# Patient Record
Sex: Male | Born: 1980 | Race: Black or African American | Hispanic: No | Marital: Single | Smoking: Former smoker
Health system: Southern US, Community
[De-identification: ages and names within clinical notes are randomized; demographics above are authoritative.]

## PROBLEM LIST (undated history)

## (undated) DIAGNOSIS — Z789 Other specified health status: Secondary | ICD-10-CM

## (undated) HISTORY — PX: NO PAST SURGERIES: SHX2092

---

## 2008-05-12 ENCOUNTER — Emergency Department: Payer: Self-pay | Admitting: Emergency Medicine

## 2008-10-24 ENCOUNTER — Emergency Department: Payer: Self-pay | Admitting: Emergency Medicine

## 2012-08-21 ENCOUNTER — Encounter (HOSPITAL_COMMUNITY): Payer: Self-pay | Admitting: *Deleted

## 2012-08-21 ENCOUNTER — Emergency Department (HOSPITAL_COMMUNITY): Payer: Self-pay

## 2012-08-21 ENCOUNTER — Inpatient Hospital Stay (HOSPITAL_COMMUNITY)
Admission: EM | Admit: 2012-08-21 | Discharge: 2012-08-23 | DRG: 508 | Disposition: A | Discharge status: Home / Self Care | Payer: MEDICAID | Attending: Orthopedic Surgery | Admitting: Orthopedic Surgery

## 2012-08-21 DIAGNOSIS — Z79899 Other long term (current) drug therapy: Secondary | ICD-10-CM

## 2012-08-21 DIAGNOSIS — S52043B Displaced fracture of coronoid process of unspecified ulna, initial encounter for open fracture type I or II: Secondary | ICD-10-CM | POA: Diagnosis present

## 2012-08-21 DIAGNOSIS — Y92410 Unspecified street and highway as the place of occurrence of the external cause: Secondary | ICD-10-CM

## 2012-08-21 DIAGNOSIS — S0003XA Contusion of scalp, initial encounter: Secondary | ICD-10-CM | POA: Diagnosis present

## 2012-08-21 DIAGNOSIS — IMO0002 Reserved for concepts with insufficient information to code with codable children: Secondary | ICD-10-CM | POA: Diagnosis present

## 2012-08-21 DIAGNOSIS — S42402B Unspecified fracture of lower end of left humerus, initial encounter for open fracture: Secondary | ICD-10-CM

## 2012-08-21 DIAGNOSIS — F17200 Nicotine dependence, unspecified, uncomplicated: Secondary | ICD-10-CM | POA: Diagnosis present

## 2012-08-21 DIAGNOSIS — S52123B Displaced fracture of head of unspecified radius, initial encounter for open fracture type I or II: Principal | ICD-10-CM | POA: Diagnosis present

## 2012-08-21 DIAGNOSIS — F1210 Cannabis abuse, uncomplicated: Secondary | ICD-10-CM | POA: Diagnosis present

## 2012-08-21 HISTORY — DX: Other specified health status: Z78.9

## 2012-08-21 LAB — CBC WITH DIFFERENTIAL/PLATELET
Basophils Absolute: 0 10*3/uL (ref 0.0–0.1)
HCT: 40.2 % (ref 39.0–52.0)
Lymphocytes Relative: 13 % (ref 12–46)
Monocytes Absolute: 1 10*3/uL (ref 0.1–1.0)
Neutro Abs: 9.1 10*3/uL — ABNORMAL HIGH (ref 1.7–7.7)
RDW: 14.2 % (ref 11.5–15.5)
WBC: 11.6 10*3/uL — ABNORMAL HIGH (ref 4.0–10.5)

## 2012-08-21 LAB — POCT I-STAT, CHEM 8
Calcium, Ion: 1.11 mmol/L — ABNORMAL LOW (ref 1.12–1.23)
Glucose, Bld: 108 mg/dL — ABNORMAL HIGH (ref 70–99)
HCT: 44 % (ref 39.0–52.0)
Hemoglobin: 15 g/dL (ref 13.0–17.0)
TCO2: 23 mmol/L (ref 0–100)

## 2012-08-21 MED ORDER — FENTANYL CITRATE 0.05 MG/ML IJ SOLN
100.0000 ug | Freq: Once | INTRAMUSCULAR | Status: AC
  Administered 2012-08-21: 100 ug via INTRAVENOUS
  Filled 2012-08-21: qty 2

## 2012-08-21 MED ORDER — TETANUS-DIPHTH-ACELL PERTUSSIS 5-2.5-18.5 LF-MCG/0.5 IM SUSP
INTRAMUSCULAR | Status: AC
  Filled 2012-08-21: qty 0.5

## 2012-08-21 MED ORDER — TETANUS-DIPHTH-ACELL PERTUSSIS 5-2.5-18.5 LF-MCG/0.5 IM SUSP
0.5000 mL | Freq: Once | INTRAMUSCULAR | Status: AC
  Administered 2012-08-21: 0.5 mL via INTRAMUSCULAR
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
## 2012-08-22 ENCOUNTER — Encounter (HOSPITAL_COMMUNITY): Payer: Self-pay | Admitting: Certified Registered Nurse Anesthetist

## 2012-08-22 ENCOUNTER — Emergency Department (HOSPITAL_COMMUNITY): Payer: Self-pay

## 2012-08-22 ENCOUNTER — Encounter (HOSPITAL_COMMUNITY)
Admission: EM | Disposition: A | Discharge status: Home / Self Care | Payer: Self-pay | Source: Home / Self Care | Attending: Orthopedic Surgery

## 2012-08-22 ENCOUNTER — Emergency Department (HOSPITAL_COMMUNITY): Payer: Self-pay | Admitting: Certified Registered Nurse Anesthetist

## 2012-08-22 HISTORY — PX: ORIF ELBOW FRACTURE: SHX5031

## 2012-08-22 LAB — URINALYSIS, ROUTINE W REFLEX MICROSCOPIC
Ketones, ur: NEGATIVE mg/dL
Leukocytes, UA: NEGATIVE
Nitrite: NEGATIVE
Protein, ur: NEGATIVE mg/dL
Urobilinogen, UA: 0.2 mg/dL (ref 0.0–1.0)
pH: 5.5 (ref 5.0–8.0)

## 2012-08-22 LAB — CK TOTAL AND CKMB (NOT AT ARMC)
CK, MB: 4.1 ng/mL — ABNORMAL HIGH (ref 0.3–4.0)
Relative Index: 0.7 (ref 0.0–2.5)
Total CK: 550 U/L — ABNORMAL HIGH (ref 7–232)

## 2012-08-22 LAB — COMPREHENSIVE METABOLIC PANEL
Alkaline Phosphatase: 51 U/L (ref 39–117)
BUN: 10 mg/dL (ref 6–23)
Calcium: 8.9 mg/dL (ref 8.4–10.5)
GFR calc Af Amer: >90 mL/min (ref >90)
Glucose, Bld: 108 mg/dL — ABNORMAL HIGH (ref 70–99)
Total Protein: 7.1 g/dL (ref 6.0–8.3)

## 2012-08-22 SURGERY — OPEN REDUCTION INTERNAL FIXATION (ORIF) ELBOW/OLECRANON FRACTURE
Anesthesia: General | Site: Arm Lower | Laterality: Left | Wound class: Dirty or Infected

## 2012-08-22 MED ORDER — GLYCOPYRROLATE 0.2 MG/ML IJ SOLN
INTRAMUSCULAR | Status: DC | PRN
  Administered 2012-08-22: .8 mg via INTRAVENOUS

## 2012-08-22 MED ORDER — MORPHINE SULFATE 2 MG/ML IJ SOLN
1.0000 mg | INTRAMUSCULAR | Status: DC | PRN
  Administered 2012-08-22 – 2012-08-23 (×7): 1 mg via INTRAVENOUS
  Filled 2012-08-22 (×7): qty 1

## 2012-08-22 MED ORDER — ONDANSETRON HCL 4 MG/2ML IJ SOLN
4.0000 mg | Freq: Once | INTRAMUSCULAR | Status: AC
  Administered 2012-08-22: 4 mg via INTRAVENOUS

## 2012-08-22 MED ORDER — ONDANSETRON HCL 4 MG/2ML IJ SOLN
INTRAMUSCULAR | Status: DC | PRN
  Administered 2012-08-22 (×2): 4 mg via INTRAVENOUS

## 2012-08-22 MED ORDER — METHOCARBAMOL 500 MG PO TABS
500.0000 mg | ORAL_TABLET | Freq: Four times a day (QID) | ORAL | Status: DC | PRN
  Administered 2012-08-22: 500 mg via ORAL
  Filled 2012-08-22: qty 1

## 2012-08-22 MED ORDER — ACETAMINOPHEN 500 MG PO TABS
1000.0000 mg | ORAL_TABLET | Freq: Four times a day (QID) | ORAL | Status: DC | PRN
  Administered 2012-08-22: 975 mg via ORAL
  Filled 2012-08-22: qty 2

## 2012-08-22 MED ORDER — FENTANYL CITRATE 0.05 MG/ML IJ SOLN
INTRAMUSCULAR | Status: DC | PRN
  Administered 2012-08-22 (×2): 50 ug via INTRAVENOUS
  Administered 2012-08-22 (×2): 100 ug via INTRAVENOUS
  Administered 2012-08-22 (×2): 50 ug via INTRAVENOUS

## 2012-08-22 MED ORDER — SUCCINYLCHOLINE CHLORIDE 20 MG/ML IJ SOLN
INTRAMUSCULAR | Status: DC | PRN
  Administered 2012-08-22: 120 mg via INTRAVENOUS

## 2012-08-22 MED ORDER — DOCUSATE SODIUM 100 MG PO CAPS
100.0000 mg | ORAL_CAPSULE | Freq: Two times a day (BID) | ORAL | Status: DC
  Administered 2012-08-22 – 2012-08-23 (×3): 100 mg via ORAL
  Filled 2012-08-22 (×3): qty 1

## 2012-08-22 MED ORDER — DEXAMETHASONE SODIUM PHOSPHATE 4 MG/ML IJ SOLN
INTRAMUSCULAR | Status: DC | PRN
  Administered 2012-08-22: 8 mg via INTRAVENOUS

## 2012-08-22 MED ORDER — LACTATED RINGERS IV SOLN
INTRAVENOUS | Status: DC
  Administered 2012-08-22: via INTRAVENOUS

## 2012-08-22 MED ORDER — VITAMIN C 500 MG PO TABS
1000.0000 mg | ORAL_TABLET | Freq: Every day | ORAL | Status: DC
  Administered 2012-08-22 – 2012-08-23 (×2): 1000 mg via ORAL
  Filled 2012-08-22 (×2): qty 2

## 2012-08-22 MED ORDER — LIDOCAINE HCL 4 % MT SOLN
OROMUCOSAL | Status: DC | PRN
  Administered 2012-08-22: 3 mL via TOPICAL

## 2012-08-22 MED ORDER — BACITRACIN-NEOMYCIN-POLYMYXIN 400-5-5000 EX OINT
TOPICAL_OINTMENT | CUTANEOUS | Status: AC
  Filled 2012-08-22: qty 1

## 2012-08-22 MED ORDER — ONDANSETRON HCL 4 MG/2ML IJ SOLN
4.0000 mg | Freq: Once | INTRAMUSCULAR | Status: AC | PRN
  Administered 2012-08-22: 4 mg via INTRAVENOUS

## 2012-08-22 MED ORDER — MIDAZOLAM HCL 5 MG/5ML IJ SOLN
INTRAMUSCULAR | Status: DC | PRN
  Administered 2012-08-22: 2 mg via INTRAVENOUS

## 2012-08-22 MED ORDER — ONDANSETRON HCL 4 MG/2ML IJ SOLN: 4.0000 mg | Freq: Four times a day (QID) | INTRAMUSCULAR | Status: DC | PRN

## 2012-08-22 MED ORDER — HYDROMORPHONE HCL PF 1 MG/ML IJ SOLN
1.0000 mg | Freq: Once | INTRAMUSCULAR | Status: AC
  Administered 2012-08-22: 1 mg via INTRAVENOUS

## 2012-08-22 MED ORDER — OXYCODONE HCL 5 MG PO TABS: 5.0000 mg | ORAL_TABLET | Freq: Once | ORAL | Status: DC | PRN

## 2012-08-22 MED ORDER — BACITRACIN-NEOMYCIN-POLYMYXIN 400-5-5000 EX OINT
TOPICAL_OINTMENT | CUTANEOUS | Status: DC | PRN
  Administered 2012-08-22: 1 via TOPICAL

## 2012-08-22 MED ORDER — METHOCARBAMOL 100 MG/ML IJ SOLN
500.0000 mg | Freq: Four times a day (QID) | INTRAVENOUS | Status: DC | PRN
  Filled 2012-08-22: qty 5

## 2012-08-22 MED ORDER — TAPENTADOL HCL 50 MG PO TABS
100.0000 mg | ORAL_TABLET | ORAL | Status: DC | PRN
  Administered 2012-08-22 (×2): 100 mg via ORAL
  Filled 2012-08-22 (×2): qty 2

## 2012-08-22 MED ORDER — HYDROMORPHONE HCL PF 1 MG/ML IJ SOLN
1.0000 mg | INTRAMUSCULAR | Status: AC
  Administered 2012-08-22: 1 mg via INTRAVENOUS
  Filled 2012-08-22: qty 1

## 2012-08-22 MED ORDER — SODIUM CHLORIDE 0.9 % IV SOLN
INTRAVENOUS | Status: DC | PRN
  Administered 2012-08-22: 03:00:00 via INTRAVENOUS

## 2012-08-22 MED ORDER — HYDROMORPHONE HCL PF 1 MG/ML IJ SOLN: 0.2500 mg | INTRAMUSCULAR | Status: DC | PRN

## 2012-08-22 MED ORDER — PROMETHAZINE HCL 25 MG/ML IJ SOLN
INTRAMUSCULAR | Status: AC
  Filled 2012-08-22: qty 1

## 2012-08-22 MED ORDER — PROMETHAZINE HCL 25 MG/ML IJ SOLN
6.2500 mg | INTRAMUSCULAR | Status: AC | PRN
  Administered 2012-08-22 (×2): 6.25 mg via INTRAVENOUS

## 2012-08-22 MED ORDER — LIDOCAINE HCL (CARDIAC) 20 MG/ML IV SOLN
INTRAVENOUS | Status: DC | PRN
  Administered 2012-08-22: 60 mg via INTRAVENOUS

## 2012-08-22 MED ORDER — PROMETHAZINE HCL 25 MG/ML IJ SOLN
6.2500 mg | Freq: Four times a day (QID) | INTRAMUSCULAR | Status: DC | PRN
Start: 2012-08-22 — End: 2012-08-22

## 2012-08-22 MED ORDER — 0.9 % SODIUM CHLORIDE (POUR BTL) OPTIME
TOPICAL | Status: DC | PRN
  Administered 2012-08-22: 1000 mL

## 2012-08-22 MED ORDER — ONDANSETRON HCL 4 MG/2ML IJ SOLN
INTRAMUSCULAR | Status: AC
  Filled 2012-08-22: qty 2

## 2012-08-22 MED ORDER — DEXTROSE 5 % IV SOLN
INTRAVENOUS | Status: DC | PRN
  Administered 2012-08-22: 03:00:00 via INTRAVENOUS

## 2012-08-22 MED ORDER — SODIUM CHLORIDE 0.9 % IR SOLN
Status: DC | PRN
  Administered 2012-08-22 (×2): 3000 mL

## 2012-08-22 MED ORDER — ROCURONIUM BROMIDE 100 MG/10ML IV SOLN
INTRAVENOUS | Status: DC | PRN
  Administered 2012-08-22: 50 mg via INTRAVENOUS
  Administered 2012-08-22: 10 mg via INTRAVENOUS

## 2012-08-22 MED ORDER — NEOSTIGMINE METHYLSULFATE 1 MG/ML IJ SOLN
INTRAMUSCULAR | Status: DC | PRN
  Administered 2012-08-22: 5 mg via INTRAVENOUS

## 2012-08-22 MED ORDER — CEFAZOLIN SODIUM 1-5 GM-% IV SOLN
1.0000 g | Freq: Once | INTRAVENOUS | Status: AC
  Administered 2012-08-22: 1 g via INTRAVENOUS
  Filled 2012-08-22: qty 50

## 2012-08-22 MED ORDER — PROPOFOL 10 MG/ML IV BOLUS
INTRAVENOUS | Status: DC | PRN
  Administered 2012-08-22: 200 mg via INTRAVENOUS

## 2012-08-22 MED ORDER — CEFAZOLIN SODIUM 1-5 GM-% IV SOLN
INTRAVENOUS | Status: DC | PRN
  Administered 2012-08-22: 1 g via INTRAVENOUS

## 2012-08-22 MED ORDER — PROMETHAZINE HCL 25 MG RE SUPP: 12.5000 mg | Freq: Four times a day (QID) | RECTAL | Status: DC | PRN

## 2012-08-22 MED ORDER — FAMOTIDINE 20 MG PO TABS: 20.0000 mg | ORAL_TABLET | Freq: Two times a day (BID) | ORAL | Status: DC | PRN

## 2012-08-22 MED ORDER — GENTAMICIN SULFATE 40 MG/ML IJ SOLN
560.0000 mg | INTRAVENOUS | Status: AC
  Administered 2012-08-22 – 2012-08-23 (×2): 560 mg via INTRAVENOUS
  Filled 2012-08-22 (×2): qty 14

## 2012-08-22 MED ORDER — ACETAMINOPHEN 325 MG PO TABS
ORAL_TABLET | ORAL | Status: AC
  Filled 2012-08-22: qty 3

## 2012-08-22 MED ORDER — CEFAZOLIN SODIUM 1-5 GM-% IV SOLN
1.0000 g | Freq: Three times a day (TID) | INTRAVENOUS | Status: DC
  Administered 2012-08-22 – 2012-08-23 (×4): 1 g via INTRAVENOUS
  Filled 2012-08-22 (×6): qty 50

## 2012-08-22 MED ORDER — HYDROMORPHONE HCL PF 1 MG/ML IJ SOLN
INTRAMUSCULAR | Status: AC
  Filled 2012-08-22: qty 1

## 2012-08-22 MED ORDER — ALPRAZOLAM 0.5 MG PO TABS: 0.5000 mg | ORAL_TABLET | Freq: Four times a day (QID) | ORAL | Status: DC | PRN

## 2012-08-22 MED ORDER — OXYCODONE HCL 5 MG/5ML PO SOLN: 5.0000 mg | Freq: Once | ORAL | Status: DC | PRN

## 2012-08-22 MED ORDER — ARTIFICIAL TEARS OP OINT
TOPICAL_OINTMENT | OPHTHALMIC | Status: DC | PRN
  Administered 2012-08-22: 1 via OPHTHALMIC

## 2012-08-22 MED ORDER — LACTATED RINGERS IV SOLN
INTRAVENOUS | Status: DC | PRN
  Administered 2012-08-22 (×2): via INTRAVENOUS

## 2012-08-22 MED ORDER — ONDANSETRON HCL 4 MG PO TABS: 4.0000 mg | ORAL_TABLET | Freq: Four times a day (QID) | ORAL | Status: DC | PRN

## 2012-08-22 SURGICAL SUPPLY — 56 items
ANCHOR JUGGERKNOT SHT 1.4 SOFT (Anchor) ×4 IMPLANT
BANDAGE ELASTIC 3 VELCRO ST LF (GAUZE/BANDAGES/DRESSINGS) ×4 IMPLANT
BANDAGE ELASTIC 4 VELCRO ST LF (GAUZE/BANDAGES/DRESSINGS) ×4 IMPLANT
BANDAGE GAUZE ELAST BULKY 4 IN (GAUZE/BANDAGES/DRESSINGS) ×2 IMPLANT
BLADE AVERAGE 25X9 (BLADE) ×2 IMPLANT
BNDG ESMARK 4X9 LF (GAUZE/BANDAGES/DRESSINGS) ×2 IMPLANT
CLOTH BEACON ORANGE TIMEOUT ST (SAFETY) ×2 IMPLANT
CORDS BIPOLAR (ELECTRODE) ×2 IMPLANT
COVER MAYO STAND STRL (DRAPES) ×2 IMPLANT
COVER SURGICAL LIGHT HANDLE (MISCELLANEOUS) ×2 IMPLANT
CUFF TOURNIQUET SINGLE 18IN (TOURNIQUET CUFF) ×2 IMPLANT
DRAPE INCISE IOBAN 66X45 STRL (DRAPES) ×2 IMPLANT
DRAPE OEC MINIVIEW 54X84 (DRAPES) ×2 IMPLANT
DRSG ADAPTIC 3X8 NADH LF (GAUZE/BANDAGES/DRESSINGS) ×2 IMPLANT
GLOVE BIOGEL M STRL SZ7.5 (GLOVE) ×2 IMPLANT
GLOVE BIOGEL PI IND STRL 7.0 (GLOVE) ×1 IMPLANT
GLOVE BIOGEL PI IND STRL 7.5 (GLOVE) ×1 IMPLANT
GLOVE BIOGEL PI INDICATOR 7.0 (GLOVE) ×1
GLOVE BIOGEL PI INDICATOR 7.5 (GLOVE) ×1
GLOVE SS BIOGEL STRL SZ 8 (GLOVE) ×3 IMPLANT
GLOVE SUPERSENSE BIOGEL SZ 8 (GLOVE) ×3
GLOVE SURG SS PI 6.5 STRL IVOR (GLOVE) ×2 IMPLANT
GLOVE SURG SS PI 7.5 STRL IVOR (GLOVE) ×2 IMPLANT
GOWN PREVENTION PLUS XLARGE (GOWN DISPOSABLE) ×4 IMPLANT
GOWN STRL NON-REIN LRG LVL3 (GOWN DISPOSABLE) ×2 IMPLANT
GOWN STRL REIN XL XLG (GOWN DISPOSABLE) ×2 IMPLANT
HEAD EXPLOR 10X24MM (Head) ×2 IMPLANT
JUGGERKNOT SHORT SET 1.4MM (KITS) ×2 IMPLANT
KIT BASIN OR (CUSTOM PROCEDURE TRAY) ×2 IMPLANT
KIT ROOM TURNOVER OR (KITS) ×2 IMPLANT
MANIFOLD NEPTUNE II (INSTRUMENTS) ×2 IMPLANT
NS IRRIG 1000ML POUR BTL (IV SOLUTION) ×2 IMPLANT
PACK ORTHO EXTREMITY (CUSTOM PROCEDURE TRAY) ×2 IMPLANT
PAD ARMBOARD 7.5X6 YLW CONV (MISCELLANEOUS) ×2 IMPLANT
PAD CAST 3X4 CTTN HI CHSV (CAST SUPPLIES) ×1 IMPLANT
PAD CAST 4YDX4 CTTN HI CHSV (CAST SUPPLIES) IMPLANT
PADDING CAST COTTON 3X4 STRL (CAST SUPPLIES) ×1
PADDING CAST COTTON 4X4 STRL (CAST SUPPLIES)
SLING ARM FOAM STRAP MED (SOFTGOODS) ×2 IMPLANT
SOLUTION BETADINE 4OZ (MISCELLANEOUS) ×2 IMPLANT
SPECIMEN JAR SMALL (MISCELLANEOUS) ×2 IMPLANT
SPLINT FIBERGLASS 3X12 (CAST SUPPLIES) ×2 IMPLANT
SPLINT FIBERGLASS 4X30 (CAST SUPPLIES) ×2 IMPLANT
SPONGE GAUZE 4X4 12PLY (GAUZE/BANDAGES/DRESSINGS) ×2 IMPLANT
SPONGE SCRUB IODOPHOR (GAUZE/BANDAGES/DRESSINGS) ×2 IMPLANT
STEM IMPLANT W SCREW (Stem) ×2 IMPLANT
SUCTION FRAZIER TIP 10 FR DISP (SUCTIONS) ×2 IMPLANT
SUT MERSILENE 4 0 P 3 (SUTURE) IMPLANT
SUT PROLENE 3 0 PS 2 (SUTURE) ×6 IMPLANT
SUT PROLENE 4 0 PS 2 18 (SUTURE) IMPLANT
SUT VIC AB 0 CT1 27 (SUTURE) ×1
SUT VIC AB 0 CT1 27XBRD ANBCTR (SUTURE) ×1 IMPLANT
TOWEL OR 17X24 6PK STRL BLUE (TOWEL DISPOSABLE) ×2 IMPLANT
TOWEL OR 17X26 10 PK STRL BLUE (TOWEL DISPOSABLE) ×6 IMPLANT
TUBE CONNECTING 12X1/4 (SUCTIONS) ×2 IMPLANT
UNDERPAD 30X30 INCONTINENT (UNDERPADS AND DIAPERS) ×2 IMPLANT
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
## 2012-08-22 NOTE — Transfer of Care (Signed)
Immediate Anesthesia Transfer of Care Note  Patient: Jason Castillo.  Procedure(s) Performed: Procedure(s): OPEN REDUCTION INTERNAL FIXATION  LEFT ELBOW/OLECRANON FRACTURE (Left)  Patient Location: PACU  Anesthesia Type:General  Level of Consciousness: awake, alert  and oriented  Airway & Oxygen Therapy: Patient Spontanous Breathing and Patient connected to nasal cannula oxygen  Post-op Assessment: Report given to PACU RN and Post -op Vital signs reviewed and stable  Post vital signs: Reviewed and stable  Complications: No apparent anesthesia complications

## 2012-08-22 NOTE — Preoperative (Signed)
Beta Blockers   Reason not to administer Beta Blockers:Not Applicable 

## 2012-08-22 NOTE — Op Note (Signed)
See Dictation #[id number] Wendall Stade MD
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
## 2012-08-22 NOTE — Anesthesia Procedure Notes (Signed)
Procedure Name: Intubation Date/Time: 08/22/2012 3:01 AM Performed by: Julianne Rice Z Pre-anesthesia Checklist: Patient identified, Timeout performed, Emergency Drugs available, Suction available and Patient being monitored Patient Re-evaluated:Patient Re-evaluated prior to inductionOxygen Delivery Method: Circle system utilized Preoxygenation: Pre-oxygenation with 100% oxygen Intubation Type: Cricoid Pressure applied and IV induction Laryngoscope Size: Mac and 4 Grade View: Grade I Tube type: Oral Tube size: 8.0 mm Number of attempts: 1 Airway Equipment and Method: Stylet and LTA kit utilized Placement Confirmation: ETT inserted through vocal cords under direct vision,  breath sounds checked- equal and bilateral and positive ETCO2 Secured at: 22 cm Tube secured with: Tape Dental Injury: Teeth and Oropharynx as per pre-operative assessment

## 2012-08-22 NOTE — Anesthesia Preprocedure Evaluation (Signed)
Anesthesia Evaluation  Patient identified by MRN, date of birth, ID band Patient awake    Reviewed: Allergy & Precautions, H&P , NPO status , Patient's Chart, lab work & pertinent test results  Airway Mallampati: I TM Distance: >3 FB Neck ROM: Full    Dental  (+) Teeth Intact and Dental Advisory Given   Pulmonary  breath sounds clear to auscultation        Cardiovascular Rhythm:Regular Rate:Normal     Neuro/Psych    GI/Hepatic   Endo/Other    Renal/GU      Musculoskeletal   Abdominal   Peds  Hematology   Anesthesia Other Findings   Reproductive/Obstetrics                           Anesthesia Physical Anesthesia Plan  ASA: I and emergent  Anesthesia Plan: General   Post-op Pain Management:    Induction: Intravenous, Rapid sequence and Cricoid pressure planned  Airway Management Planned: Oral ETT  Additional Equipment:   Intra-op Plan:   Post-operative Plan: Extubation in OR  Informed Consent: I have reviewed the patients History and Physical, chart, labs and discussed the procedure including the risks, benefits and alternatives for the proposed anesthesia with the patient or authorized representative who has indicated his/her understanding and acceptance.   Dental advisory given  Plan Discussed with: CRNA, Anesthesiologist and Surgeon  Anesthesia Plan Comments:         Anesthesia Quick Evaluation
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
## 2012-08-22 NOTE — Progress Notes (Signed)
Jason Castillo. is a 32 y.o. male who has been consulted for aminoglycoside dosing.  The patient has a current serum creatinine of  Creatinine, Ser (mg/dL)  Date Value  1/61/0960 1.20    which calculates to an estimated CrCl of Estimated Creatinine Clearance: 109 ml/min (by C-G formula based on Cr of 1.2).  Current weight is 90kg  The patient will be started on gentamicin at a dose of 7 mg/kg/day per dosing Nomogram and ideal body weight.  Patient will be followed by pharmacy for changes in renal function, toxicity, and efficacy.  Serum creatinines will be ordered as needed.  Will start 560mg  IV Q24H x2 doses for post-op Px for open fracture.  Vernard Gambles, PharmD, BCPS

## 2012-08-22 NOTE — Anesthesia Postprocedure Evaluation (Signed)
  Anesthesia Post-op Note  Patient: Jason Castillo.  Procedure(s) Performed: Procedure(s): OPEN REDUCTION INTERNAL FIXATION  LEFT ELBOW/OLECRANON FRACTURE (Left)  Patient Location: PACU  Anesthesia Type:General  Level of Consciousness: awake, alert  and oriented  Airway and Oxygen Therapy: Patient Spontanous Breathing and Patient connected to nasal cannula oxygen  Post-op Pain: mild  Post-op Assessment: Post-op Vital signs reviewed and NAUSEA AND VOMITING PRESENT  Post-op Vital Signs: Reviewed  Complications: No apparent anesthesia complications

## 2012-08-22 NOTE — Progress Notes (Signed)
OT Cancellation Note  Patient Details Name: Jason Castillo. MRN: [id number] DOB: [birth year]   Cancelled Treatment:     Attempted to see pt this am; however, pt sleeping soundly.  Will reattempt this pm as able, or in am  [name] [phone] 08/22/2012, 6:30 PM
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
## 2012-08-23 ENCOUNTER — Encounter (HOSPITAL_COMMUNITY): Payer: Self-pay | Admitting: Orthopedic Surgery

## 2012-08-23 LAB — CBC WITH DIFFERENTIAL/PLATELET
Basophils Relative: 0 % (ref 0–1)
Eosinophils Absolute: 0 10*3/uL (ref 0.0–0.7)
Eosinophils Relative: 0 % (ref 0–5)
HCT: 35.3 % — ABNORMAL LOW (ref 39.0–52.0)
Hemoglobin: 11.7 g/dL — ABNORMAL LOW (ref 13.0–17.0)
MCH: 25.1 pg — ABNORMAL LOW (ref 26.0–34.0)
MCHC: 33.1 g/dL (ref 30.0–36.0)
MCV: 75.6 fL — ABNORMAL LOW (ref 78.0–100.0)
Monocytes Absolute: 1.6 10*3/uL — ABNORMAL HIGH (ref 0.1–1.0)
Monocytes Relative: 13 % — ABNORMAL HIGH (ref 3–12)

## 2012-08-23 LAB — BASIC METABOLIC PANEL
BUN: 7 mg/dL (ref 6–23)
CO2: 27 mEq/L (ref 19–32)
Chloride: 104 mEq/L (ref 96–112)
GFR calc non Af Amer: >90 mL/min (ref >90)
Glucose, Bld: 121 mg/dL — ABNORMAL HIGH (ref 70–99)
Potassium: 3.7 mEq/L (ref 3.5–5.1)

## 2012-08-23 MED ORDER — CEPHALEXIN 500 MG PO CAPS: 500.0000 mg | ORAL_CAPSULE | Freq: Four times a day (QID) | ORAL | Status: DC

## 2012-08-23 MED ORDER — METHOCARBAMOL 500 MG PO TABS: 500.0000 mg | ORAL_TABLET | Freq: Three times a day (TID) | ORAL | Status: DC

## 2012-08-23 MED ORDER — HYDROCODONE-ACETAMINOPHEN 5-325 MG PO TABS: 2.0000 | ORAL_TABLET | ORAL | Status: DC | PRN
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
## 2012-08-23 NOTE — Discharge Instructions (Signed)
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
## 2012-08-24 ENCOUNTER — Emergency Department (HOSPITAL_COMMUNITY): Payer: No Typology Code available for payment source

## 2012-08-24 ENCOUNTER — Encounter (HOSPITAL_COMMUNITY): Payer: Self-pay | Admitting: *Deleted

## 2012-08-24 ENCOUNTER — Telehealth (HOSPITAL_COMMUNITY): Payer: Self-pay | Admitting: *Deleted

## 2012-08-24 ENCOUNTER — Emergency Department (HOSPITAL_COMMUNITY)
Admission: EM | Admit: 2012-08-24 | Discharge: 2012-08-24 | Disposition: A | Discharge status: Home / Self Care | Payer: No Typology Code available for payment source | Attending: Emergency Medicine | Admitting: Emergency Medicine

## 2012-08-24 DIAGNOSIS — R112 Nausea with vomiting, unspecified: Secondary | ICD-10-CM

## 2012-08-24 DIAGNOSIS — R42 Dizziness and giddiness: Secondary | ICD-10-CM | POA: Insufficient documentation

## 2012-08-24 DIAGNOSIS — F17200 Nicotine dependence, unspecified, uncomplicated: Secondary | ICD-10-CM | POA: Insufficient documentation

## 2012-08-24 DIAGNOSIS — Z79899 Other long term (current) drug therapy: Secondary | ICD-10-CM | POA: Insufficient documentation

## 2012-08-24 DIAGNOSIS — Z87828 Personal history of other (healed) physical injury and trauma: Secondary | ICD-10-CM | POA: Insufficient documentation

## 2012-08-24 DIAGNOSIS — Z5189 Encounter for other specified aftercare: Secondary | ICD-10-CM | POA: Insufficient documentation

## 2012-08-24 DIAGNOSIS — F0781 Postconcussional syndrome: Secondary | ICD-10-CM | POA: Insufficient documentation

## 2012-08-24 DIAGNOSIS — R51 Headache: Secondary | ICD-10-CM | POA: Insufficient documentation

## 2012-08-24 DIAGNOSIS — S060X0S Concussion without loss of consciousness, sequela: Secondary | ICD-10-CM

## 2012-08-24 LAB — CBC WITH DIFFERENTIAL/PLATELET
Basophils Relative: 0 % (ref 0–1)
Eosinophils Absolute: 0 10*3/uL (ref 0.0–0.7)
Eosinophils Relative: 0 % (ref 0–5)
MCH: 25.3 pg — ABNORMAL LOW (ref 26.0–34.0)
MCHC: 33.4 g/dL (ref 30.0–36.0)
MCV: 75.6 fL — ABNORMAL LOW (ref 78.0–100.0)
Neutrophils Relative %: 76 % (ref 43–77)
Platelets: 230 10*3/uL (ref 150–400)

## 2012-08-24 LAB — POCT I-STAT, CHEM 8
Glucose, Bld: 119 mg/dL — ABNORMAL HIGH (ref 70–99)
HCT: 41 % (ref 39.0–52.0)
Hemoglobin: 13.9 g/dL (ref 13.0–17.0)
Potassium: 3.2 mEq/L — ABNORMAL LOW (ref 3.5–5.1)
Sodium: 138 mEq/L (ref 135–145)
TCO2: 26 mmol/L (ref 0–100)

## 2012-08-24 MED ORDER — IOHEXOL 350 MG/ML SOLN
80.0000 mL | Freq: Once | INTRAVENOUS | Status: AC | PRN
  Administered 2012-08-24: 80 mL via INTRAVENOUS

## 2012-08-24 MED ORDER — MORPHINE SULFATE 4 MG/ML IJ SOLN
4.0000 mg | Freq: Once | INTRAMUSCULAR | Status: AC
  Administered 2012-08-24: 4 mg via INTRAVENOUS
  Filled 2012-08-24: qty 1

## 2012-08-24 MED ORDER — ONDANSETRON HCL 4 MG/2ML IJ SOLN
4.0000 mg | Freq: Once | INTRAMUSCULAR | Status: AC
  Administered 2012-08-24: 4 mg via INTRAVENOUS
  Filled 2012-08-24: qty 2

## 2012-08-24 MED ORDER — TRAMADOL HCL 50 MG PO TABS: 50.0000 mg | ORAL_TABLET | Freq: Four times a day (QID) | ORAL | Status: DC | PRN

## 2012-08-24 MED ORDER — SODIUM CHLORIDE 0.9 % IV BOLUS (SEPSIS)
500.0000 mL | Freq: Once | INTRAVENOUS | Status: AC
  Administered 2012-08-24: 500 mL via INTRAVENOUS

## 2012-08-24 MED ORDER — PROMETHAZINE HCL 25 MG PO TABS: 25.0000 mg | ORAL_TABLET | Freq: Four times a day (QID) | ORAL | Status: DC | PRN

## 2012-08-24 MED ORDER — ONDANSETRON 8 MG PO TBDP: 8.0000 mg | ORAL_TABLET | Freq: Three times a day (TID) | ORAL | Status: DC | PRN

## 2012-08-24 NOTE — ED Notes (Signed)
Dr Pickering at bedside 
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
## 2012-08-24 NOTE — ED Notes (Signed)
1915  Received report from off going RN and introduced self to the pt.

## 2012-08-24 NOTE — ED Notes (Signed)
Pt taken to CT.

## 2015-02-28 ENCOUNTER — Encounter: Payer: Self-pay | Admitting: *Deleted

## 2015-02-28 ENCOUNTER — Emergency Department
Admission: EM | Admit: 2015-02-28 | Discharge: 2015-02-28 | Disposition: A | Discharge status: Home / Self Care | Payer: No Typology Code available for payment source | Attending: Emergency Medicine | Admitting: Emergency Medicine

## 2015-02-28 DIAGNOSIS — Y9389 Activity, other specified: Secondary | ICD-10-CM | POA: Insufficient documentation

## 2015-02-28 DIAGNOSIS — Z792 Long term (current) use of antibiotics: Secondary | ICD-10-CM | POA: Insufficient documentation

## 2015-02-28 DIAGNOSIS — S81812A Laceration without foreign body, left lower leg, initial encounter: Secondary | ICD-10-CM | POA: Insufficient documentation

## 2015-02-28 DIAGNOSIS — Z79899 Other long term (current) drug therapy: Secondary | ICD-10-CM | POA: Insufficient documentation

## 2015-02-28 DIAGNOSIS — Y998 Other external cause status: Secondary | ICD-10-CM | POA: Insufficient documentation

## 2015-02-28 DIAGNOSIS — Z23 Encounter for immunization: Secondary | ICD-10-CM | POA: Insufficient documentation

## 2015-02-28 DIAGNOSIS — S80812A Abrasion, left lower leg, initial encounter: Secondary | ICD-10-CM | POA: Insufficient documentation

## 2015-02-28 DIAGNOSIS — S81802A Unspecified open wound, left lower leg, initial encounter: Secondary | ICD-10-CM | POA: Insufficient documentation

## 2015-02-28 DIAGNOSIS — Z87891 Personal history of nicotine dependence: Secondary | ICD-10-CM | POA: Insufficient documentation

## 2015-02-28 DIAGNOSIS — S80811A Abrasion, right lower leg, initial encounter: Secondary | ICD-10-CM | POA: Insufficient documentation

## 2015-02-28 DIAGNOSIS — W540XXA Bitten by dog, initial encounter: Secondary | ICD-10-CM | POA: Insufficient documentation

## 2015-02-28 DIAGNOSIS — Y9289 Other specified places as the place of occurrence of the external cause: Secondary | ICD-10-CM | POA: Insufficient documentation

## 2015-02-28 MED ORDER — LIDOCAINE-EPINEPHRINE (PF) 1 %-1:200000 IJ SOLN
INTRAMUSCULAR | Status: DC
Start: 2015-02-28 — End: 2015-03-01
  Filled 2015-02-28: qty 30

## 2015-02-28 MED ORDER — LIDOCAINE-EPINEPHRINE (PF) 1 %-1:200000 IJ SOLN
30.0000 mL | Freq: Once | INTRAMUSCULAR | Status: AC
  Administered 2015-02-28: 30 mL

## 2015-02-28 MED ORDER — IBUPROFEN 800 MG PO TABS
800.0000 mg | ORAL_TABLET | Freq: Once | ORAL | Status: AC
  Administered 2015-02-28: 800 mg via ORAL
  Filled 2015-02-28: qty 1

## 2015-02-28 MED ORDER — AMOXICILLIN-POT CLAVULANATE 875-125 MG PO TABS: 1.0000 | ORAL_TABLET | Freq: Two times a day (BID) | ORAL | Status: AC

## 2015-02-28 MED ORDER — AMOXICILLIN-POT CLAVULANATE 875-125 MG PO TABS
1.0000 | ORAL_TABLET | Freq: Once | ORAL | Status: AC
Start: 2015-02-28 — End: 2015-02-28
  Administered 2015-02-28: 1 via ORAL
  Filled 2015-02-28: qty 1

## 2015-02-28 MED ORDER — MORPHINE SULFATE (PF) 4 MG/ML IV SOLN: 4.0000 mg | Freq: Once | INTRAVENOUS | Status: AC

## 2015-02-28 MED ORDER — ONDANSETRON 4 MG PO TBDP: 4.0000 mg | ORAL_TABLET | Freq: Once | ORAL | Status: AC

## 2015-02-28 MED ORDER — HYDROCODONE-ACETAMINOPHEN 5-325 MG PO TABS
1.0000 | ORAL_TABLET | Freq: Once | ORAL | Status: AC
  Administered 2015-02-28: 1 via ORAL
  Filled 2015-02-28: qty 1

## 2015-02-28 MED ORDER — HYDROCODONE-ACETAMINOPHEN 5-325 MG PO TABS: 1.0000 | ORAL_TABLET | ORAL | Status: DC | PRN

## 2015-02-28 MED ORDER — MORPHINE SULFATE (PF) 4 MG/ML IV SOLN
INTRAVENOUS | Status: AC
  Administered 2015-02-28: 4 mg via INTRAMUSCULAR
  Filled 2015-02-28: qty 1

## 2015-02-28 MED ORDER — TETANUS-DIPHTH-ACELL PERTUSSIS 5-2.5-18.5 LF-MCG/0.5 IM SUSP
0.5000 mL | Freq: Once | INTRAMUSCULAR | Status: AC
  Administered 2015-02-28: 0.5 mL via INTRAMUSCULAR
  Filled 2015-02-28: qty 0.5

## 2015-02-28 MED ORDER — ONDANSETRON 4 MG PO TBDP
ORAL_TABLET | ORAL | Status: AC
  Administered 2015-02-28: 4 mg via ORAL
  Filled 2015-02-28: qty 1

## 2015-02-28 NOTE — Discharge Instructions (Signed)
Take antibiotics and pain medicine as directed. Return in proximal to 2 days for wound recheck. Change the dressing 1-2 times a day. Do not get the area soaking wet.
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
## 2015-02-28 NOTE — ED Provider Notes (Signed)
[facility] Emergency Department Provider Note  ____________________________________________  Time seen: Approximately 10:35 PM  I have reviewed the triage vital signs and the nursing notes.   HISTORY  Chief Complaint Animal Bite    HPI Jason Castillo. is a 35 y.o. male who was bitten by his neighbors, well-known dog prior to arrival. Unprovoked. However the dog has not shown unusual behavior. He has bitten this patient before. The patient is not up-to-date on tetanus. He complains of pain and wounds to the left shin and right lateral calf region. He killed the dog after the attack.   Past Medical History  Diagnosis Date  . Medical history non-contributory     There are no active problems to display for this patient.   Past Surgical History  Procedure Laterality Date  . No past surgeries    . Orif elbow fracture Left 08/22/2012    Procedure: OPEN REDUCTION INTERNAL FIXATION  LEFT ELBOW/OLECRANON FRACTURE;  Surgeon: Dominica Severin, MD;  Location: [facility] OR;  Service: Orthopedics;  Laterality: Left;    Current Outpatient Rx  Name  Route  Sig  Dispense  Refill  . amoxicillin-clavulanate (AUGMENTIN) 875-125 MG tablet   Oral   Take 1 tablet by mouth 2 (two) times daily.   14 tablet   0   . cephALEXin (KEFLEX) 500 MG capsule   Oral   Take 1 capsule (500 mg total) by mouth 4 (four) times daily.   45 capsule   0   . HYDROcodone-acetaminophen (NORCO) 5-325 MG per tablet   Oral   Take 2 tablets by mouth every 4 (four) hours as needed for pain.   40 tablet   0   . HYDROcodone-acetaminophen (NORCO) 5-325 MG tablet   Oral   Take 1 tablet by mouth every 4 (four) hours as needed for moderate pain.   20 tablet   0   . methocarbamol (ROBAXIN) 500 MG tablet   Oral   Take 1 tablet (500 mg total) by mouth 3 (three) times daily.   40 tablet   0   . ondansetron (ZOFRAN-ODT) 8 MG disintegrating tablet   Oral   Take 1 tablet (8 mg total) by mouth every  8 (eight) hours as needed for nausea.   5 tablet   0   . promethazine (PHENERGAN) 25 MG tablet   Oral   Take 1 tablet (25 mg total) by mouth every 6 (six) hours as needed for nausea.   20 tablet   0   . senna-docusate (SENOKOT-S) 8.6-50 MG per tablet   Oral   Take 1 tablet by mouth daily as needed for constipation.         . traMADol (ULTRAM) 50 MG tablet   Oral   Take 1 tablet (50 mg total) by mouth every 6 (six) hours as needed for pain.   15 tablet   0     Allergies Percocet  History reviewed. No pertinent family history.  Social History Social History  Substance Use Topics  . Smoking status: Former Smoker -- 0.00 packs/day for 15 years  . Smokeless tobacco: None  . Alcohol Use: 3.6 oz/week    6 Cans of beer per week     Comment: every three days; socially    Review of Systems Constitutional: No fever/chills Eyes: No visual changes. ENT: No sore throat. Cardiovascular: Denies chest pain. Respiratory: Denies shortness of breath. Gastrointestinal: No abdominal pain.  No nausea, no vomiting.  No diarrhea.  No  constipation. Genitourinary: Negative for dysuria. Musculoskeletal: Negative for back pain. Skin: see HPI Neurological: Negative for headaches, focal weakness or numbness. 10-point ROS otherwise negative.  ____________________________________________   PHYSICAL EXAM:  VITAL SIGNS: ED Triage Vitals  Enc Vitals Group     BP 02/28/15 1936 164/83 mmHg     Pulse Rate 02/28/15 1936 121     Resp 02/28/15 1936 16     Temp 02/28/15 1936 99 F (37.2 C)     Temp Source 02/28/15 1936 Oral     SpO2 02/28/15 1936 96 %     Weight 02/28/15 1936 190 lb (86.183 kg)     Height 02/28/15 1936 6' (1.829 m)     Head Cir --      Peak Flow --      Pain Score 02/28/15 1936 10     Pain Loc --      Pain Edu? --      Excl. in GC? --     Constitutional: Alert and oriented. Well appearing and in no acute distress. Eyes: Conjunctivae are normal.  Respiratory:  Normal respiratory effort.   Neurologic:  Normal speech and language. No gross focal neurologic deficits are appreciated. No gait instability. Skin:  Approximate 5 cm gaping wound to the left lower shin, with subcutaneous fat visible.  Abrasions to the distal left leg and right lateral calf, not requiring closure. Psychiatric: Mood and affect are normal. Speech and behavior are normal.  ____________________________________________   LABS (all labs ordered are listed, but only abnormal results are displayed)  Labs Reviewed - No data to display ____________________________________________  EKG   ____________________________________________  RADIOLOGY   ____________________________________________   PROCEDURES  Procedure(s) performed: LACERATION REPAIR Performed by: Jason Castillo Authorized by: Jason Castillo Consent: Verbal consent obtained. Risks and benefits: risks, benefits and alternatives were discussed Consent given by: patient Patient identity confirmed: provided demographic data Prepped and Draped in normal sterile fashion Wound explored  Laceration Location: left shin  Laceration Length: 5cm  No Foreign Bodies seen or palpated  Anesthesia: local infiltration  Local anesthetic: lidocaine 1% with epinephrine  Anesthetic total: 10 ml  Irrigation method: syringe Amount of cleaning: EXCESS SALINE   Skin closure: nylon sutures  Number of sutures: 5  Technique: simple interrupted with 2mm gap for drainage.  Patient tolerance: Patient tolerated the procedure well with no immediate complications.   Critical Care performed: No  ____________________________________________   INITIAL IMPRESSION / ASSESSMENT AND PLAN / ED COURSE  Pertinent labs & imaging results that were available during my care of the patient were reviewed by me and considered in my medical decision making (see chart for details).  35 year old male who was bitten by his neighbors dog  prior to arrival. Patient is unaware of the vaccine status of the dog. Please have been contacted and are investigating. The dog has not shown atypical behavior of late. Closure of gaping wound as per procedure above. Given tetanus, Augmentin and pain medicine, Norco for pain control. Encouraged follow-up in approximately 2 days for wound recheck ____________________________________________   FINAL CLINICAL IMPRESSION(S) / ED DIAGNOSES  Final diagnoses:  Dog bite      Jason Bayley, PA-C 02/28/15 2244  Minna Antis, MD 02/28/15 2329

## 2015-03-02 ENCOUNTER — Emergency Department
Admission: EM | Admit: 2015-03-02 | Discharge: 2015-03-02 | Disposition: A | Discharge status: Home / Self Care | Payer: No Typology Code available for payment source | Attending: Emergency Medicine | Admitting: Emergency Medicine

## 2015-03-02 ENCOUNTER — Encounter: Payer: Self-pay | Admitting: Emergency Medicine

## 2015-03-02 DIAGNOSIS — W540XXD Bitten by dog, subsequent encounter: Secondary | ICD-10-CM | POA: Insufficient documentation

## 2015-03-02 DIAGNOSIS — Z792 Long term (current) use of antibiotics: Secondary | ICD-10-CM | POA: Insufficient documentation

## 2015-03-02 DIAGNOSIS — S81852D Open bite, left lower leg, subsequent encounter: Secondary | ICD-10-CM

## 2015-03-02 DIAGNOSIS — Z4801 Encounter for change or removal of surgical wound dressing: Secondary | ICD-10-CM | POA: Diagnosis not present

## 2015-03-02 DIAGNOSIS — S81812D Laceration without foreign body, left lower leg, subsequent encounter: Secondary | ICD-10-CM | POA: Diagnosis not present

## 2015-03-02 DIAGNOSIS — Z87891 Personal history of nicotine dependence: Secondary | ICD-10-CM | POA: Diagnosis not present

## 2015-03-02 DIAGNOSIS — Z79899 Other long term (current) drug therapy: Secondary | ICD-10-CM | POA: Insufficient documentation

## 2015-03-02 DIAGNOSIS — Z5189 Encounter for other specified aftercare: Secondary | ICD-10-CM

## 2015-03-02 DIAGNOSIS — S81851D Open bite, right lower leg, subsequent encounter: Secondary | ICD-10-CM | POA: Diagnosis not present

## 2015-03-02 NOTE — ED Provider Notes (Signed)
[facility] Emergency Department Provider Note  ____________________________________________  Time seen: Approximately 1:19 PM  I have reviewed the triage vital signs and the nursing notes.   HISTORY  Chief Complaint Wound Check    HPI Jason Castillo. is a 35 y.o. male who presents emergency department for wound recheck. Patient was seen in this department on 02/28/2015 for dog bite. Patient's leg was sutured with 5 sutures, given tetanus, and placed on antibiotics. He presents for wound recheck. He denies any increased redness or swelling to area. He denies any fevers or chills. He states there is some skin numbness right around laceration.He has been taking antibiotics as prescribed.   Past Medical History  Diagnosis Date  . Medical history non-contributory     There are no active problems to display for this patient.   Past Surgical History  Procedure Laterality Date  . No past surgeries    . Orif elbow fracture Left 08/22/2012    Procedure: OPEN REDUCTION INTERNAL FIXATION  LEFT ELBOW/OLECRANON FRACTURE;  Surgeon: Dominica Severin, MD;  Location: [facility] OR;  Service: Orthopedics;  Laterality: Left;    Current Outpatient Rx  Name  Route  Sig  Dispense  Refill  . amoxicillin-clavulanate (AUGMENTIN) 875-125 MG tablet   Oral   Take 1 tablet by mouth 2 (two) times daily.   14 tablet   0   . cephALEXin (KEFLEX) 500 MG capsule   Oral   Take 1 capsule (500 mg total) by mouth 4 (four) times daily.   45 capsule   0   . HYDROcodone-acetaminophen (NORCO) 5-325 MG per tablet   Oral   Take 2 tablets by mouth every 4 (four) hours as needed for pain.   40 tablet   0   . HYDROcodone-acetaminophen (NORCO) 5-325 MG tablet   Oral   Take 1 tablet by mouth every 4 (four) hours as needed for moderate pain.   20 tablet   0   . methocarbamol (ROBAXIN) 500 MG tablet   Oral   Take 1 tablet (500 mg total) by mouth 3 (three) times daily.   40 tablet   0   .  ondansetron (ZOFRAN-ODT) 8 MG disintegrating tablet   Oral   Take 1 tablet (8 mg total) by mouth every 8 (eight) hours as needed for nausea.   5 tablet   0   . promethazine (PHENERGAN) 25 MG tablet   Oral   Take 1 tablet (25 mg total) by mouth every 6 (six) hours as needed for nausea.   20 tablet   0   . senna-docusate (SENOKOT-S) 8.6-50 MG per tablet   Oral   Take 1 tablet by mouth daily as needed for constipation.         . traMADol (ULTRAM) 50 MG tablet   Oral   Take 1 tablet (50 mg total) by mouth every 6 (six) hours as needed for pain.   15 tablet   0     Allergies Percocet  No family history on file.  Social History Social History  Substance Use Topics  . Smoking status: Former Smoker -- 0.00 packs/day for 15 years  . Smokeless tobacco: None  . Alcohol Use: 3.6 oz/week    6 Cans of beer per week     Comment: every three days; socially     Review of Systems  Constitutional: No fever/chills Musculoskeletal: Negative for back pain. Skin: Negative for rash. Positive for laceration from dog bite to left leg.  Neurological: Negative for headaches, focal weakness or numbness. 10-point ROS otherwise negative.  ____________________________________________   PHYSICAL EXAM:  VITAL SIGNS: ED Triage Vitals  Enc Vitals Group     BP 03/02/15 1118 128/80 mmHg     Pulse Rate 03/02/15 1118 79     Resp 03/02/15 1118 18     Temp 03/02/15 1118 97.7 F (36.5 C)     Temp Source 03/02/15 1118 Oral     SpO2 03/02/15 1118 97 %     Weight 03/02/15 1118 190 lb (86.183 kg)     Height 03/02/15 1118 6' (1.829 m)     Head Cir --      Peak Flow --      Pain Score 03/02/15 1123 6     Pain Loc --      Pain Edu? --      Excl. in GC? --      Constitutional: Alert and oriented. Well appearing and in no acute distress. Cardiovascular: Normal rate, regular rhythm. Normal S1 and S2.  Good peripheral circulation. Respiratory: Normal respiratory effort without tachypnea or  retractions. Lungs CTAB. Musculoskeletal: No lower extremity tenderness nor edema.  No joint effusions. Neurologic:  Normal speech and language. No gross focal neurologic deficits are appreciated.  Skin:  Skin is warm, dry and intact. No rash noted. Sutured laceration noted to the left medial lower leg. 5 sutures are in place. Area is healing appropriately. No surrounding erythema or edema. No purulent drainage noted from site. Psychiatric: Mood and affect are normal. Speech and behavior are normal. Patient exhibits appropriate insight and judgement.   ____________________________________________   LABS (all labs ordered are listed, but only abnormal results are displayed)  Labs Reviewed - No data to display ____________________________________________  EKG   ____________________________________________  RADIOLOGY   No results found.  ____________________________________________    PROCEDURES  Procedure(s) performed:       Medications - No data to display   ____________________________________________   INITIAL IMPRESSION / ASSESSMENT AND PLAN / ED COURSE  Pertinent labs & imaging results that were available during my care of the patient were reviewed by me and considered in my medical decision making (see chart for details).  Patient's diagnosis is consistent with a wound recheck. This area is healing appropriately with no signs of infection at this time. Patient is to continue previously prescribed medications. Patient will follow-up in 7-10 days for suture removal.Patient is given ED precautions to return to the ED for any worsening or new symptoms.     ____________________________________________  FINAL CLINICAL IMPRESSION(S) / ED DIAGNOSES  Final diagnoses:  Visit for wound check  Dog bite of calf, left, subsequent encounter      NEW MEDICATIONS STARTED DURING THIS VISIT:  Discharge Medication List as of 03/02/2015  1:25 PM           Delorise Royals Sujata Maines, PA-C 03/02/15 1418  Sharman Cheek, MD 03/02/15 1524

## 2015-03-02 NOTE — Discharge Instructions (Signed)

## 2015-03-02 NOTE — ED Notes (Signed)
Here for recheck of dogbite wound on legs

## 2017-12-04 ENCOUNTER — Emergency Department: Payer: Self-pay

## 2017-12-04 ENCOUNTER — Encounter: Payer: Self-pay | Admitting: Medical Oncology

## 2017-12-04 ENCOUNTER — Emergency Department
Admission: EM | Admit: 2017-12-04 | Discharge: 2017-12-04 | Disposition: A | Discharge status: Home / Self Care | Payer: Self-pay | Attending: Emergency Medicine | Admitting: Emergency Medicine

## 2017-12-04 DIAGNOSIS — T148XXA Other injury of unspecified body region, initial encounter: Secondary | ICD-10-CM

## 2017-12-04 DIAGNOSIS — S61259A Open bite of unspecified finger without damage to nail, initial encounter: Secondary | ICD-10-CM

## 2017-12-04 DIAGNOSIS — Y939 Activity, unspecified: Secondary | ICD-10-CM | POA: Insufficient documentation

## 2017-12-04 DIAGNOSIS — L089 Local infection of the skin and subcutaneous tissue, unspecified: Secondary | ICD-10-CM

## 2017-12-04 DIAGNOSIS — S61200A Unspecified open wound of right index finger without damage to nail, initial encounter: Secondary | ICD-10-CM | POA: Insufficient documentation

## 2017-12-04 DIAGNOSIS — Z87891 Personal history of nicotine dependence: Secondary | ICD-10-CM | POA: Insufficient documentation

## 2017-12-04 DIAGNOSIS — Y999 Unspecified external cause status: Secondary | ICD-10-CM | POA: Insufficient documentation

## 2017-12-04 DIAGNOSIS — Y929 Unspecified place or not applicable: Secondary | ICD-10-CM | POA: Insufficient documentation

## 2017-12-04 DIAGNOSIS — W503XXA Accidental bite by another person, initial encounter: Secondary | ICD-10-CM

## 2017-12-04 LAB — CBC WITH DIFFERENTIAL/PLATELET
ABS IMMATURE GRANULOCYTES: 0.04 10*3/uL (ref 0.00–0.07)
BASOS ABS: 0 10*3/uL (ref 0.0–0.1)
Basophils Relative: 0 %
EOS PCT: 1 %
Eosinophils Absolute: 0.1 10*3/uL (ref 0.0–0.5)
HCT: 43.3 % (ref 39.0–52.0)
HEMOGLOBIN: 13.3 g/dL (ref 13.0–17.0)
Immature Granulocytes: 0 %
LYMPHS PCT: 13 %
Lymphs Abs: 1.4 10*3/uL (ref 0.7–4.0)
MCH: 23.8 pg — ABNORMAL LOW (ref 26.0–34.0)
MCHC: 30.7 g/dL (ref 30.0–36.0)
MCV: 77.6 fL — ABNORMAL LOW (ref 80.0–100.0)
Monocytes Absolute: 1.3 10*3/uL — ABNORMAL HIGH (ref 0.1–1.0)
Monocytes Relative: 12 %
NEUTROS ABS: 7.6 10*3/uL (ref 1.7–7.7)
NRBC: 0 % (ref 0.0–0.2)
Neutrophils Relative %: 74 %
Platelets: 302 10*3/uL (ref 150–400)
RBC: 5.58 MIL/uL (ref 4.22–5.81)
RDW: 14.6 % (ref 11.5–15.5)
WBC: 10.3 10*3/uL (ref 4.0–10.5)

## 2017-12-04 MED ORDER — AMOXICILLIN-POT CLAVULANATE 875-125 MG PO TABS
1.0000 | ORAL_TABLET | Freq: Once | ORAL | Status: AC
  Administered 2017-12-04: 1 via ORAL
  Filled 2017-12-04 (×2): qty 1

## 2017-12-04 MED ORDER — ONDANSETRON 4 MG PO TBDP
4.0000 mg | ORAL_TABLET | Freq: Once | ORAL | Status: AC
  Administered 2017-12-04: 4 mg via ORAL
  Filled 2017-12-04: qty 1

## 2017-12-04 MED ORDER — TETANUS-DIPHTH-ACELL PERTUSSIS 5-2.5-18.5 LF-MCG/0.5 IM SUSP
0.5000 mL | Freq: Once | INTRAMUSCULAR | Status: AC
  Administered 2017-12-04: 0.5 mL via INTRAMUSCULAR
  Filled 2017-12-04: qty 0.5

## 2017-12-04 MED ORDER — SODIUM CHLORIDE 0.9 % IV SOLN
3.0000 g | Freq: Once | INTRAVENOUS | Status: AC
  Administered 2017-12-04: 3 g via INTRAVENOUS
  Filled 2017-12-04: qty 3

## 2017-12-04 MED ORDER — OXYCODONE-ACETAMINOPHEN 5-325 MG PO TABS
1.0000 | ORAL_TABLET | Freq: Once | ORAL | Status: AC
  Administered 2017-12-04: 1 via ORAL
  Filled 2017-12-04: qty 1

## 2017-12-04 MED ORDER — TRAMADOL HCL 50 MG PO TABS: 50.0000 mg | ORAL_TABLET | Freq: Three times a day (TID) | ORAL | 0 refills | Status: AC | PRN

## 2017-12-04 MED ORDER — AMOXICILLIN-POT CLAVULANATE 875-125 MG PO TABS: 1.0000 | ORAL_TABLET | Freq: Two times a day (BID) | ORAL | 0 refills | Status: DC
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
## 2018-06-11 ENCOUNTER — Emergency Department: Payer: Self-pay

## 2018-06-11 ENCOUNTER — Emergency Department
Admission: EM | Admit: 2018-06-11 | Discharge: 2018-06-11 | Disposition: A | Discharge status: Home / Self Care | Payer: Self-pay | Attending: Emergency Medicine | Admitting: Emergency Medicine

## 2018-06-11 ENCOUNTER — Other Ambulatory Visit: Payer: Self-pay

## 2018-06-11 DIAGNOSIS — S60041A Contusion of right ring finger without damage to nail, initial encounter: Secondary | ICD-10-CM | POA: Insufficient documentation

## 2018-06-11 DIAGNOSIS — Z87891 Personal history of nicotine dependence: Secondary | ICD-10-CM | POA: Insufficient documentation

## 2018-06-11 DIAGNOSIS — Y998 Other external cause status: Secondary | ICD-10-CM | POA: Insufficient documentation

## 2018-06-11 DIAGNOSIS — Z79899 Other long term (current) drug therapy: Secondary | ICD-10-CM | POA: Insufficient documentation

## 2018-06-11 DIAGNOSIS — Y92810 Car as the place of occurrence of the external cause: Secondary | ICD-10-CM | POA: Insufficient documentation

## 2018-06-11 DIAGNOSIS — F1210 Cannabis abuse, uncomplicated: Secondary | ICD-10-CM | POA: Insufficient documentation

## 2018-06-11 DIAGNOSIS — Y9389 Activity, other specified: Secondary | ICD-10-CM | POA: Insufficient documentation

## 2018-06-11 DIAGNOSIS — Y289XXA Contact with unspecified sharp object, undetermined intent, initial encounter: Secondary | ICD-10-CM | POA: Insufficient documentation

## 2018-06-11 MED ORDER — CEPHALEXIN 500 MG PO CAPS: 500.0000 mg | ORAL_CAPSULE | Freq: Four times a day (QID) | ORAL | 0 refills | Status: DC

## 2018-06-11 MED ORDER — IBUPROFEN 600 MG PO TABS: 600.0000 mg | ORAL_TABLET | Freq: Three times a day (TID) | ORAL | 0 refills | Status: DC | PRN

## 2018-06-11 NOTE — ED Provider Notes (Signed)
[facility] Emergency Department Provider Note  ____________________________________________   First MD Initiated Contact with Patient 06/11/18 [id number]     (approximate)  I have reviewed the triage vital signs and the nursing notes.   HISTORY  Chief Complaint Finger Injury   HPI Jason L Talbert CageWhite Jr. is a 38 y.o. male presents to the ED with complaint of right fourth finger pain.  Patient states that 2 weeks ago he was cleaning out a car and stuck his hand between the seats.  He states he felt something stick his fourth finger.  Since that time it has stayed swollen and tender to touch.  His pain is 6 out of 10.     Past Medical History:  Diagnosis Date  . Medical history non-contributory     There are no active problems to display for this patient.   Past Surgical History:  Procedure Laterality Date  . NO PAST SURGERIES    . ORIF ELBOW FRACTURE Left 08/22/2012   Procedure: OPEN REDUCTION INTERNAL FIXATION  LEFT ELBOW/OLECRANON FRACTURE;  Surgeon: Dominica SeverinWilliam Gramig, MD;  Location: [facility] OR;  Service: Orthopedics;  Laterality: Left;    Prior to Admission medications   Medication Sig Start Date End Date Taking? Authorizing Provider  DULoxetine (CYMBALTA) 60 MG capsule Take 60 mg by mouth daily.   Yes [provider]  methocarbamol (ROBAXIN) 500 MG tablet Take 500 mg by mouth 4 (four) times daily.   Yes [provider]  cephALEXin (KEFLEX) 500 MG capsule Take 1 capsule (500 mg total) by mouth 4 (four) times daily. 06/11/18   Tommi RumpsSummers, Alyssha Housh L, PA-C  ibuprofen (ADVIL) 600 MG tablet Take 1 tablet (600 mg total) by mouth every 8 (eight) hours as needed. 06/11/18   Tommi RumpsSummers, Klare Criss L, PA-C    Allergies Percocet [oxycodone-acetaminophen]  No family history on file.  Social History Social History   Tobacco Use  . Smoking status: Former Smoker    Packs/day: 0.00    Years: 15.00    Pack years: 0.00  . Smokeless tobacco: Never Used  Substance Use  Topics  . Alcohol use: Yes    Alcohol/week: 6.0 standard drinks    Types: 6 Cans of beer per week    Comment: every three days; socially  . Drug use: Yes    Types: Marijuana    Review of Systems Constitutional: No fever/chills Cardiovascular: Denies chest pain. Respiratory: Denies shortness of breath. Musculoskeletal: Right fourth finger pain. Skin: Negative for functional wound. Neurological: Negative for  focal weakness or numbness. ___________________________________________   PHYSICAL EXAM:  VITAL SIGNS: ED Triage Vitals  Enc Vitals Group     BP 06/11/18 0918 (!) 143/86     Pulse Rate 06/11/18 0918 81     Resp 06/11/18 0918 17     Temp 06/11/18 0918 98.6 F (37 C)     Temp Source 06/11/18 0918 Oral     SpO2 06/11/18 0918 99 %     Weight 06/11/18 0913 212 lb (96.2 kg)     Height 06/11/18 0913 6' (1.829 m)     Head Circumference --      Peak Flow --      Pain Score 06/11/18 0913 6     Pain Loc --      Pain Edu? --      Excl. in [place]? --    Constitutional: Alert and oriented. Well appearing and in no acute distress. Eyes: Conjunctivae are normal.  Head: Atraumatic. Neck: No stridor.  Cardiovascular:  Good peripheral circulation. Respiratory: Normal respiratory effort.  No retractions.  Musculoskeletal: On examination of the right fourth finger there is some tenderness at the base of the nail.  There is an area that looks like an early paronychia but no obvious fluctuance.  Area is tender to palpation.  No warmth or redness at this time.  No evidence of puncture wound.  Patient has guarded range of motion secondary to pain. Neurologic:  Normal speech and language. No gross focal neurologic deficits are appreciated.  Skin:  Skin is warm, dry and intact.  Psychiatric: Mood and affect are normal. Speech and behavior are normal.  ____________________________________________   LABS (all labs ordered are listed, but only abnormal results are displayed)  Labs Reviewed  - No data to display ____________________________________________  RADIOLOGY  ED MD interpretation:   Right fourth digit without fracture or foreign body noted.  Official radiology report(s): Dg Finger Ring Right  Result Date: 06/11/2018 CLINICAL DATA:  Trauma to finger while cleaning car. Swelling and tenderness. EXAM: RIGHT RING FINGER 2+V COMPARISON:  12/04/2017 FINDINGS: There is no evidence of fracture or dislocation. There is no evidence of arthropathy or other focal bone abnormality. Soft tissues are unremarkable. IMPRESSION: Negative. Electronically Signed   By: Kerby Moors M.D.   On: 06/11/2018 09:53    ____________________________________________   PROCEDURES  Procedure(s) performed (including Critical Care):  Procedures Splint was applied to finger by RN.  ____________________________________________   INITIAL IMPRESSION / ASSESSMENT AND PLAN / ED COURSE  As part of my medical decision making, I reviewed the following data within the electronic MEDICAL RECORD NUMBER Notes from prior ED visits and [place] Controlled Substance Database  38 year old male presents to the ED with complaint of injury to his right fourth finger 2 weeks ago when he stuck his hand between 2 seats while he was cleaning a car.  He was unaware of any puncture wound at the time and he did not see any bleeding.  The finger has become swollen and tender.  On exam there appears to be an early paronychia but no actual fluctuance was noted.  X-ray was negative for bony injury or foreign body.  Patient was placed in a finger splint for protection.  He was given instructions to soak his hand in warm water 2-3 times per day.  He was given a prescription for Keflex 500 mg 4 times daily for the next 7 days and ibuprofen as needed for inflammation and pain.  He will return to the emergency department if any severe worsening of his symptoms or no improvement.  ____________________________________________   FINAL  CLINICAL IMPRESSION(S) / ED DIAGNOSES  Final diagnoses:  Contusion of right ring finger without damage to nail, initial encounter     ED Discharge Orders         Ordered    cephALEXin (KEFLEX) 500 MG capsule  4 times daily     06/11/18 1031    ibuprofen (ADVIL) 600 MG tablet  Every 8 hours PRN     06/11/18 1031           Note:  This document was prepared using Dragon voice recognition software and may include unintentional dictation errors.    Johnn Hai, PA-C 06/11/18 1043    Earleen Newport, MD 06/11/18 1256

## 2018-06-11 NOTE — ED Notes (Signed)
See triage note  States he was cleaning a car  Stuck his hand between the seats  Felt something stick his 4 th finger    Finger is swollen and tender

## 2018-06-11 NOTE — ED Triage Notes (Signed)
Pt states he injured his right 4th finger 2 weeks ago

## 2018-06-11 NOTE — Discharge Instructions (Signed)
Follow-up with [facility] acute care or return to the emergency department if no improvement or if you see an area of pus that we can open up and drain.  Begin using warm compresses or soak your hand in warm water 2-3 times a day.  Begin taking Keflex 500 mg as directed for infection and ibuprofen as needed for inflammation and pain.  Wear finger splint for protection when you are working.

## 2018-06-18 ENCOUNTER — Encounter: Payer: Self-pay | Admitting: Emergency Medicine

## 2018-06-18 ENCOUNTER — Emergency Department
Admission: EM | Admit: 2018-06-18 | Discharge: 2018-06-18 | Disposition: A | Discharge status: Home / Self Care | Payer: Medicaid Other | Attending: Student in an Organized Health Care Education/Training Program | Admitting: Student in an Organized Health Care Education/Training Program

## 2018-06-18 ENCOUNTER — Other Ambulatory Visit: Payer: Self-pay

## 2018-06-18 DIAGNOSIS — Z87891 Personal history of nicotine dependence: Secondary | ICD-10-CM | POA: Insufficient documentation

## 2018-06-18 DIAGNOSIS — L03011 Cellulitis of right finger: Secondary | ICD-10-CM

## 2018-06-18 MED ORDER — LIDOCAINE HCL (PF) 1 % IJ SOLN: 5.0000 mL | Freq: Once | INTRAMUSCULAR | Status: AC

## 2018-06-18 MED ORDER — NAPROXEN 500 MG PO TABS: 500.0000 mg | ORAL_TABLET | Freq: Two times a day (BID) | ORAL | Status: DC

## 2018-06-18 MED ORDER — LIDOCAINE HCL (PF) 1 % IJ SOLN
INTRAMUSCULAR | Status: AC
  Administered 2018-06-18: 5 mL
  Filled 2018-06-18: qty 5

## 2018-06-18 NOTE — ED Notes (Signed)
See triage note  Presents with cont'd pain and swelling to right 4 th finger  Was seen last week   Placed on antibiotics   Finger is still painful with swelling noted  Initial injury was about 3 weeks ago

## 2018-06-18 NOTE — ED Provider Notes (Signed)
[facility] Emergency Department Provider Note   ____________________________________________   None    (approximate)  I have reviewed the triage vital signs and the nursing notes.   HISTORY  Chief Complaint Hand Pain and Wound Infection    HPI Jason L Talbert CageWhite Jr. is a 38 y.o. male patient is follow-up with for contusion of the right ring finger which occurred approximate 2 weeks ago.  Patient was seen as a facility on 06/11/2018 was placed on antibiotics secondary to non-fluctuant swelling to the fingertip.  Patient state he is finished antibiotics but continued her swelling and increased pain to the finger.  Patient denies loss of sensation or loss of function.  Patient rates the pain as a 6/10.  Patient described pain as "sore/pressure".  No other palliative measures for complaint.         Past Medical History:  Diagnosis Date  . Medical history non-contributory     There are no active problems to display for this patient.   Past Surgical History:  Procedure Laterality Date  . NO PAST SURGERIES    . ORIF ELBOW FRACTURE Left 08/22/2012   Procedure: OPEN REDUCTION INTERNAL FIXATION  LEFT ELBOW/OLECRANON FRACTURE;  Surgeon: Dominica SeverinWilliam Gramig, MD;  Location: [facility] OR;  Service: Orthopedics;  Laterality: Left;    Prior to Admission medications   Medication Sig Start Date End Date Taking? Authorizing Provider  cephALEXin (KEFLEX) 500 MG capsule Take 1 capsule (500 mg total) by mouth 4 (four) times daily. 06/11/18   Tommi RumpsSummers, Rhonda L, PA-C  DULoxetine (CYMBALTA) 60 MG capsule Take 60 mg by mouth daily.    [provider]  ibuprofen (ADVIL) 600 MG tablet Take 1 tablet (600 mg total) by mouth every 8 (eight) hours as needed. 06/11/18   Tommi RumpsSummers, Rhonda L, PA-C  naproxen (NAPROSYN) 500 MG tablet Take 1 tablet (500 mg total) by mouth 2 (two) times daily with a meal. 06/18/18   Joni ReiningSmith,  K, PA-C    Allergies Percocet [oxycodone-acetaminophen]  No family  history on file.  Social History Social History   Tobacco Use  . Smoking status: Former Smoker    Packs/day: 0.00    Years: 15.00    Pack years: 0.00  . Smokeless tobacco: Never Used  Substance Use Topics  . Alcohol use: Yes    Alcohol/week: 6.0 standard drinks    Types: 6 Cans of beer per week    Comment: every three days; socially  . Drug use: Yes    Types: Marijuana    Review of Systems Constitutional: No fever/chills Eyes: No visual changes. ENT: No sore throat. Cardiovascular: Denies chest pain. Respiratory: Denies shortness of breath. Gastrointestinal: No abdominal pain.  No nausea, no vomiting.  No diarrhea.  No constipation. Genitourinary: Negative for dysuria. Musculoskeletal: Negative for back pain. Skin: Edema and erythema to right distal ring finger. Neurological: Negative for headaches, focal weakness or numbness.  Allergic/Immunilogical: Percocets. ____________________________________________   PHYSICAL EXAM:  VITAL SIGNS: ED Triage Vitals [06/18/18 0801]  Enc Vitals Group     BP      Pulse      Resp      Temp      Temp src      SpO2      Weight 215 lb (97.5 kg)     Height 6' (1.829 m)     Head Circumference      Peak Flow      Pain Score 6     Pain Loc  Pain Edu?      Excl. in [place]?    Constitutional: Alert and oriented. Well appearing and in no acute distress. Cardiovascular: Normal rate, regular rhythm. Grossly normal heart sounds.  Good peripheral circulation. Respiratory: Normal respiratory effort.  No retractions. Lungs CTAB. Neurologic:  Normal speech and language. No gross focal neurologic deficits are appreciated. No gait instability. Skin: Nodule lesion to the distal right ring finger. Psychiatric: Mood and affect are normal. Speech and behavior are normal.  ____________________________________________   LABS (all labs ordered are listed, but only abnormal results are displayed)  Labs Reviewed - No data to display  ____________________________________________  EKG   ____________________________________________  RADIOLOGY  ED MD interpretation:    Official radiology report(s): No results found.  ____________________________________________   PROCEDURES  Procedure(s) performed (including Critical Care):  Marland KitchenMarland KitchenIncision and Drainage  Date/Time: 06/18/2018 8:19 AM Performed by: Sable Feil, PA-C Authorized by: Sable Feil, PA-C   Consent:    Consent obtained:  Verbal   Consent given by:  Patient   Risks discussed:  Bleeding, incomplete drainage and pain Location:    Type:  Abscess   Location:  Upper extremity   Upper extremity location:  Finger   Finger location:  R ring finger Pre-procedure details:    Skin preparation:  Betadine Anesthesia (see MAR for exact dosages):    Anesthesia method:  Nerve block   Block needle gauge:  25 G   Block anesthetic:  Lidocaine 1% w/o epi   Block injection procedure:  Anatomic landmarks identified   Block outcome:  Anesthesia achieved Procedure type:    Complexity:  Simple Procedure details:    Needle aspiration: no     Incision types:  Stab incision   Incision depth:  Dermal   Wound management:  Probed and deloculated   Drainage:  Purulent   Drainage amount:  Scant   Wound treatment:  Wound left open Post-procedure details:    Patient tolerance of procedure:  Tolerated well, no immediate complications     ____________________________________________   INITIAL IMPRESSION / ASSESSMENT AND PLAN / ED COURSE  As part of my medical decision making, I reviewed the following data within the [place]         Patient presents pain edema to right ring finger secondary to paronychia.  Discussed with patient rationale for incision and drainage this time.  Patient given discharge care instructions and advised him back condition worsens.      ____________________________________________   FINAL CLINICAL  IMPRESSION(S) / ED DIAGNOSES  Final diagnoses:  Paronychia of finger of right hand     ED Discharge Orders         Ordered    naproxen (NAPROSYN) 500 MG tablet  2 times daily with meals     06/18/18 0829           Note:  This document was prepared using Dragon voice recognition software and may include unintentional dictation errors.    Sable Feil, PA-C 06/18/18 4010    Merlyn Lot, MD 06/18/18 [phone]

## 2018-06-18 NOTE — ED Triage Notes (Signed)
Pt reports infection to his right hand ring finger and reports has taken abx but no better. Denies fever.

## 2018-06-18 NOTE — Discharge Instructions (Signed)
Follow discharge care instruction and use Epson salt solution soaks as directed.
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
## 2019-08-19 ENCOUNTER — Other Ambulatory Visit: Payer: Medicaid Other

## 2019-08-19 ENCOUNTER — Other Ambulatory Visit: Payer: Self-pay

## 2019-08-19 DIAGNOSIS — Z20822 Contact with and (suspected) exposure to covid-19: Secondary | ICD-10-CM

## 2019-08-20 LAB — NOVEL CORONAVIRUS, NAA: SARS-CoV-2, NAA: DETECTED — AB

## 2019-08-20 LAB — SARS-COV-2, NAA 2 DAY TAT

## 2019-10-07 ENCOUNTER — Ambulatory Visit: Payer: Medicaid Other | Attending: Internal Medicine

## 2019-10-07 DIAGNOSIS — Z23 Encounter for immunization: Secondary | ICD-10-CM

## 2019-10-07 NOTE — Progress Notes (Signed)
   [id number]  Name:  Taylin Leder.    MRN: [id number] DOB: May 11, 1980  10/07/2019  Mr. Hopkinson was observed post Covid-19 immunization for 15 minutes without incident. He was provided with Vaccine Information Sheet and instruction to access the V-Safe system.   Mr. Mondry was instructed to call 911 with any severe reactions post vaccine: Marland Kitchen Difficulty breathing  . Swelling of face and throat  . A fast heartbeat  . A bad rash all over body  . Dizziness and weakness   Immunizations Administered    Name Date Dose VIS Date Route   Pfizer COVID-19 Vaccine 10/07/2019  4:10 PM 0.3 mL 02/27/2018 Intramuscular   Manufacturer: [employer], [employer]   Lot: [id number]   NDC: [address]-4

## 2019-10-28 ENCOUNTER — Ambulatory Visit: Payer: Medicaid Other

## 2019-11-11 ENCOUNTER — Ambulatory Visit: Payer: Medicaid Other

## 2019-12-28 IMAGING — DX RIGHT RING FINGER 2+V
3 series · 3 of 3 positions shown · non-contrast
Comparison: 12/04/2017

CLINICAL DATA: Trauma to finger while cleaning car. Swelling and
tenderness.

EXAM:
RIGHT RING FINGER 2+V

[finger ap]
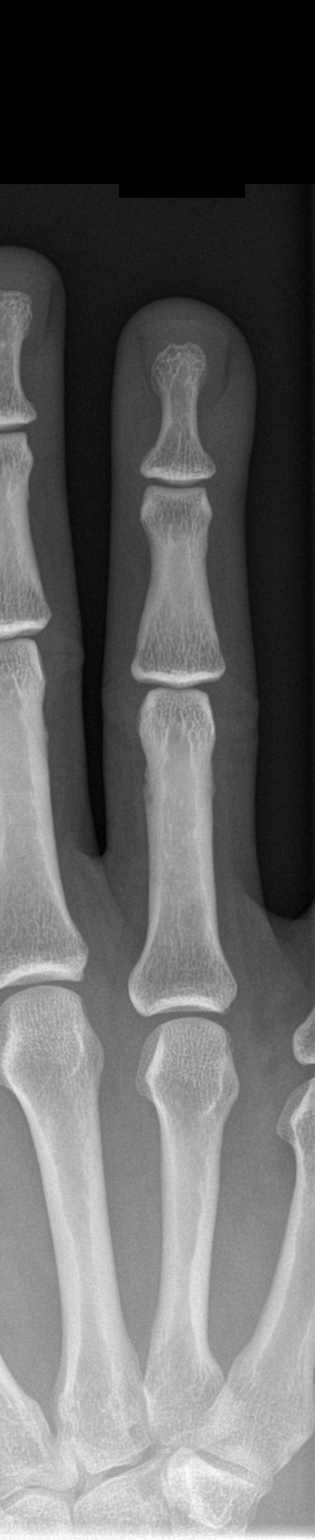

[finger obl]
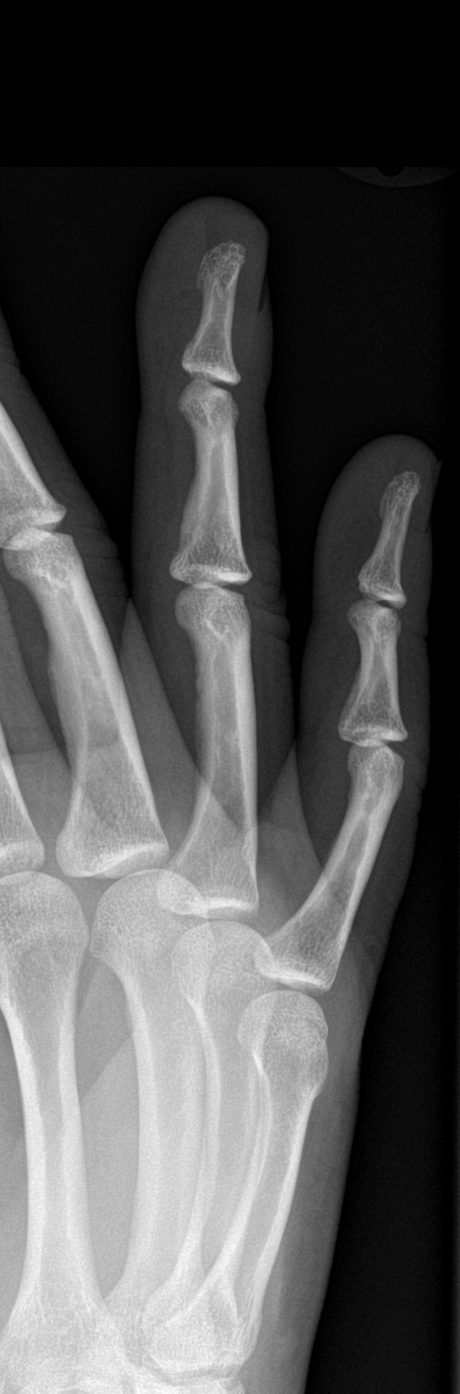

[finger lat]
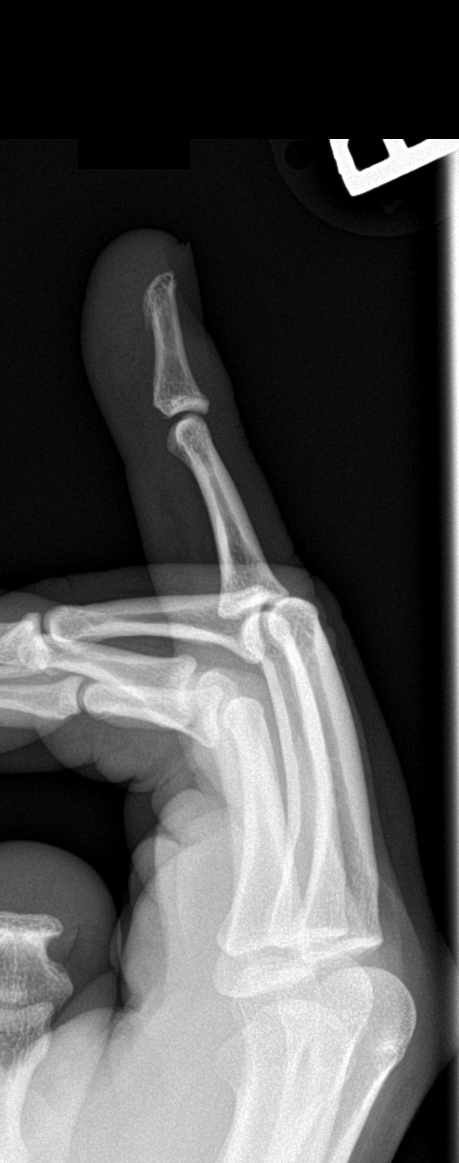

[3 of 3 positions shown; findings below may reference images not displayed]

FINDINGS: There is no evidence of fracture or dislocation. There is no
evidence of arthropathy or other focal bone abnormality. Soft
tissues are unremarkable.
IMPRESSION: Negative.

## 2023-01-30 ENCOUNTER — Emergency Department: Payer: Medicaid Other

## 2023-01-30 ENCOUNTER — Emergency Department
Admission: EM | Admit: 2023-01-30 | Discharge: 2023-01-30 | Disposition: A | Discharge status: Home / Self Care | Payer: Medicaid Other | Attending: Emergency Medicine | Admitting: Emergency Medicine

## 2023-01-30 ENCOUNTER — Other Ambulatory Visit: Payer: Self-pay

## 2023-01-30 DIAGNOSIS — M25531 Pain in right wrist: Secondary | ICD-10-CM | POA: Insufficient documentation

## 2023-01-30 MED ORDER — IBUPROFEN 400 MG PO TABS
400.0000 mg | ORAL_TABLET | Freq: Once | ORAL | Status: AC | PRN
  Administered 2023-01-30: 400 mg via ORAL

## 2023-01-30 MED ORDER — IBUPROFEN 800 MG PO TABS
800.0000 mg | ORAL_TABLET | Freq: Three times a day (TID) | ORAL | 0 refills | Status: AC | PRN
Start: 2023-01-30 — End: ?

## 2023-01-30 MED ORDER — IBUPROFEN 400 MG PO TABS
ORAL_TABLET | ORAL | Status: AC
  Filled 2023-01-30: qty 1

## 2023-01-30 NOTE — ED Triage Notes (Signed)
Patient states he woke up with pain in his right wrist. Patient denies injury to the wrist. States before he went to sleep the wrist was normal and without pain. No swelling observed, +2 radial pulse. Rates pain 10/10

## 2023-01-30 NOTE — ED Provider Notes (Signed)
[facility] Provider Note    Event Date/Time   First MD Initiated Contact with Patient 01/30/23 [phone]     (approximate)   History   Wrist Pain   HPI  Jason Castillo. is a 43 y.o. male   presents to the ED with complaint of right wrist pain for approximately 18 hours.  Patient denies any recent injury.  He reports that he went to sleep with his wrist feeling normal and woke up with pain.  No previous surgeries or injuries to the right wrist.  Blood pressure was elevated when patient presented to the ED but reports no history of hypertension and no family history.      Physical Exam   Triage Vital Signs: ED Triage Vitals  Encounter Vitals Group     BP 01/30/23 0553 (!) 136/109     Systolic BP Percentile --      Diastolic BP Percentile --      Pulse Rate 01/30/23 0553 76     Resp 01/30/23 0553 18     Temp 01/30/23 0553 98.4 F (36.9 C)     Temp Source 01/30/23 0553 Oral     SpO2 01/30/23 0553 97 %     Weight 01/30/23 0554 210 lb (95.3 kg)     Height 01/30/23 0554 6' (1.829 m)     Head Circumference --      Peak Flow --      Pain Score 01/30/23 0554 10     Pain Loc --      Pain Education --      Exclude from Growth Chart --     Most recent vital signs: Vitals:   01/30/23 0553 01/30/23 0827  BP: (!) 136/109 130/88  Pulse: 76 80  Resp: 18 18  Temp: 98.4 F (36.9 C)   SpO2: 97% 98%     General: Awake, no distress.  CV:  Good peripheral perfusion.  Resp:  Normal effort.  Abd:  No distention.  Other:  Right wrist without deformity or soft tissue edema.  No skin discoloration.  Radial pulses present.  Patient is able to flex and extend digits without difficulty.  Range of motion is slow and guarded secondary to increased pain.  Capillary refills less than 3 seconds.   ED Results / Procedures / Treatments   Labs (all labs ordered are listed, but only abnormal results are displayed) Labs Reviewed - No data to  display   RADIOLOGY Right wrist x-ray images were reviewed and interpreted by myself independent of the radiologist.  No acute bony abnormality noted.    PROCEDURES:  Critical Care performed:   Procedures   MEDICATIONS ORDERED IN ED: Medications  ibuprofen (ADVIL) tablet 400 mg (400 mg Oral Given 01/30/23 0600)     IMPRESSION / MDM / ASSESSMENT AND PLAN / ED COURSE  I reviewed the triage vital signs and the nursing notes.   Differential diagnosis includes, but is not limited to, right wrist pain, strain, tendinitis, subluxation, osteoarthritis.  Elevated blood pressure.  43 year old male presents to the ED with complaint of right wrist pain with onset approximately 18 hours ago.  Patient denies any recent injury.  X-rays were reassuring and patient was made aware.  A wrist brace was applied and patient is to begin taking anti-inflammatories twice daily with food.  Patient is follow-up with his primary care provider or Dr. Joice Lofts who is on-call for orthopedics if any continued problems with his wrist.  A prescription  for ibuprofen 800 mg 3 times daily with food was sent to the pharmacy for him to begin taking.      Patient's presentation is most consistent with acute complicated illness / injury requiring diagnostic workup.  FINAL CLINICAL IMPRESSION(S) / ED DIAGNOSES   Final diagnoses:  Acute pain of right wrist     Rx / DC Orders   ED Discharge Orders          Ordered    ibuprofen (ADVIL) 800 MG tablet  Every 8 hours PRN        01/30/23 0830             Note:  This document was prepared using Dragon voice recognition software and may include unintentional dictation errors.   Tommi Rumps, PA-C 01/30/23 1610    Phineas Semen, MD 01/30/23 [phone]

## 2023-01-30 NOTE — Discharge Instructions (Signed)
Follow-up with your primary care provider if any continued problems or concerns.  Also a prescription for ibuprofen was sent to the pharmacy for you to begin taking every 8 hours with food.  If you continue to have problems with your wrist in 1 to 2 weeks you should follow-up with Dr. Joice Lofts who is on-call for orthopedics for recheck of your wrist.  Wear wrist splint for protection and support.
